# Patient Record
Sex: Female | Born: 1995 | Race: Black or African American | Hispanic: No | Marital: Single | Smoking: Current some day smoker
Health system: Southern US, Community
[De-identification: ages and names within clinical notes are randomized; demographics above are authoritative.]

## PROBLEM LIST (undated history)

## (undated) ENCOUNTER — Emergency Department (HOSPITAL_COMMUNITY): Discharge status: Absent without leave | Payer: No Typology Code available for payment source

## (undated) DIAGNOSIS — T7840XA Allergy, unspecified, initial encounter: Secondary | ICD-10-CM

## (undated) HISTORY — DX: Allergy, unspecified, initial encounter: T78.40XA

---
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
## 2013-01-04 ENCOUNTER — Ambulatory Visit (INDEPENDENT_AMBULATORY_CARE_PROVIDER_SITE_OTHER): Payer: Medicaid Other | Admitting: Pediatrics

## 2013-01-04 ENCOUNTER — Encounter: Payer: Self-pay | Admitting: Pediatrics

## 2013-01-04 VITALS — BP 110/86 | Wt 122.8 lb

## 2013-01-04 DIAGNOSIS — Z3046 Encounter for surveillance of implantable subdermal contraceptive: Secondary | ICD-10-CM

## 2013-01-04 DIAGNOSIS — Z30017 Encounter for initial prescription of implantable subdermal contraceptive: Secondary | ICD-10-CM

## 2013-01-04 DIAGNOSIS — A749 Chlamydial infection, unspecified: Secondary | ICD-10-CM

## 2013-01-04 DIAGNOSIS — Z309 Encounter for contraceptive management, unspecified: Secondary | ICD-10-CM

## 2013-01-04 LAB — POCT URINE PREGNANCY: Preg Test, Ur: NEGATIVE

## 2013-01-04 LAB — RPR

## 2013-01-04 MED ORDER — AZITHROMYCIN 500 MG PO TABS
1000.0000 mg | ORAL_TABLET | Freq: Once | ORAL | Status: AC
  Administered 2013-01-04: 1000 mg via ORAL

## 2013-01-04 NOTE — Patient Instructions (Signed)
Follow-up with Dr. Perry in 1 month. Schedule this appointment before you leave clinic today.  Congratulations on getting your Nexplanon placement!  Below is some important information about Nexplanon.  First remember that Nexplanon does not prevent sexually transmitted infections.  Condoms will help prevent sexually transmitted infections. The Nexplanon starts working 7 days after it was inserted.  There is a risk of getting pregnant if you have unprotected sex in those first 7 days after placement of the Nexplanon.  The Nexplanon lasts for 3 years but can be removed at any time.  You can become pregnant as early as 1 week after removal.  You can have a new Nexplanon put in after the old one is removed if you like.  It is not known whether Nexplanon is as effective in women who are very overweight because the studies did not include many overweight women.  Nexplanon interacts with some medications, including barbiturates, bosentan, carbamazepine, felbamate, griseofulvin, oxcarbazepine, phenytoin, rifampin, St. John's wort, topiramate, HIV medicines.  Please alert your doctor if you are on any of these medicines.  Always tell other healthcare providers that you have a Nexplanon in your arm.  The Nexplanon was placed just under the skin.  Leave the outside bandage on for 24 hours.  Leave the smaller bandage on for 3-5 days or until it falls off on its own.  Keep the area clean and dry for 3-5 days. There is usually bruising or swelling at the insertion site for a few days to a week after placement.  If you see redness or pus draining from the insertion site, call us immediately.  Keep your user card with the date the implant was placed and the date the implant is to be removed.  The most common side effect is a change in your menstrual bleeding pattern.   This bleeding is generally not harmful to you but can be annoying.  Call or come in to see us if you have any concerns about the bleeding or if  you have any side effects or questions.    We will call you in 1 week to check in and we would like you to return to the clinic for a follow-up visit in 1 month.  You can call  [facility] 24 hours a day with any questions or concerns.  There is always a nurse or doctor available to take your call.  Call 9-1-1 if you have a life-threatening emergency.  For anything else, please call us at [phone] before heading to the ER.  Please call her in 1 week to check in and also ensure she has a f/u appt scheduled in 1 month. 

## 2013-01-04 NOTE — Progress Notes (Signed)
Judith Harvey is 17 y.o. female [id number]   HPI: Pt is here for Nexplanon insertion.   Concerns today: Chlamydia positive on last appointment. Pt reports no abdominal pain currently. She experienced mild cramping after her taking the Plan B last week. She then had a normal period to follow. She denies vaginal discharge, irritation or discomfort.   No contraindications for placement.  No liver disease, no unexplained vaginal bleeding, no h/o breast cancer, no h/o blood clots.  Patient's last menstrual period was 12/28/2012. Normal menses.   UHCG: negative  Last Unprotected sex:  12/23/2012; with use of plan B after.   Risks & benefits of Nexplanon discussed The nexplanon device was purchased and supplied by [facility]. Packaging instructions supplied to patient Consent form signed  Current Outpatient Prescriptions on File Prior to Visit  Medication Sig Dispense Refill  . loratadine (CLARITIN) 10 MG tablet Take 1 tablet (10 mg total) by mouth daily. As needed for allergy symptoms  30 tablet  11  . adapalene (DIFFERIN) 0.1 % cream Apply topically at bedtime.  45 g  0  . levonorgestrel (PLAN B 1-STEP) 1.5 MG tablet Take 1 tablet (1.5 mg total) by mouth once.  1 tablet  0   No current facility-administered medications on file prior to visit.    The patient denies any allergies to anesthetics or antiseptics.  Patient Active Problem List   Diagnosis Date Noted  . Chlamydia 01/04/2013   Objective: BP 110/86  Wt 122 lb 12.8 oz (55.702 kg)  BMI 20.61 kg/m2  LMP 12/28/2012 Gen: NAD. Well. Pleasant. Abd: Soft. NTND. No masses palpated.   Procedure: Pt was placed in supine position. Left arm was flexed at the elbow and externally rotated so that her wrist was parallel to her ear The medial epicondyle of the left arm was identified The insertions site was marked 8 cm proximal to the medial epicondyle The insertion site was cleaned with Betadine The area surrounding the insertion site  was covered with a sterile drape 1% lidocaine was injected just under the skin at the insertion site extending 4 cm proximally. The sterile preloaded disposable Nexaplanon applicator was removed from the sterile packaging The applicator needle was inserted at a 30 degree angle at 8 cm proximal to the medial epicondyle as marked The applicator was lowered to a horizontal position and advanced just under the skin for the full length of the needle The slider on the applicator was retracted fully while the applicator remained in the same position, then the applicator was removed. The implant was confirmed via palpation as being in position The implant position was demonstrated to the patient Pressure dressing was applied to the patient.  The patient was instructed to removed the pressure dressing in 24 hrs.  The patient was advised to move slowly from a supine to an upright position  The patient denied any concerns or complaints  The patient was instructed to schedule a follow-up appt in 1 month. The patient will be called in 1 week to address any concerns. The patient will be treated with Azithromycin 1g once. Both parties denies allergies to Azithromycin. Partner was treated as well today. Chlamydia discussed with patient and her boyfriend in detail along with printed information given to both parties about chlamydia.   Felix Pacini, DO PGY-2 [facility]

## 2013-01-04 NOTE — Assessment & Plan Note (Signed)
-   has been in a monogamous relationship with her current boyfriend since end of May/June.  - Will treat partner and patient today with 1g Azithromycin.

## 2013-01-06 NOTE — Progress Notes (Signed)
I saw and evaluated the patient, performing the key elements of the service.  I developed the management plan that is described in the resident's note, and I agree with the content.  Nexplanon placed without complications.  Pt received 1000 mg azithromycin for treatment of chlamydia.  EPT was provided for patient's partner.  Abdn soft, NT.  No dyspareunia. Return in 1 month for f/u after nexplanon placement and check for reinfection in 3 months.

## 2013-01-14 ENCOUNTER — Telehealth: Payer: Self-pay

## 2013-01-14 NOTE — Telephone Encounter (Signed)
I attempted to call [facility]'s number and it is no longer in service.  She has not had a PPD test in our office and if needed, she can come in and have that done as a nurse's visit.  Dr. Marina Goodell has signed the physical forms and I can complete is any of you speak to her or her mother.  Thank you.

## 2013-04-07 ENCOUNTER — Ambulatory Visit: Payer: Medicaid Other | Admitting: Pediatrics

## 2013-08-13 ENCOUNTER — Emergency Department (HOSPITAL_COMMUNITY)
Admission: EM | Admit: 2013-08-13 | Discharge: 2013-08-13 | Disposition: A | Discharge status: Home / Self Care | Payer: Medicaid Other | Attending: Emergency Medicine | Admitting: Emergency Medicine

## 2013-08-13 ENCOUNTER — Encounter (HOSPITAL_COMMUNITY): Payer: Self-pay | Admitting: Emergency Medicine

## 2013-08-13 DIAGNOSIS — M26629 Arthralgia of temporomandibular joint, unspecified side: Secondary | ICD-10-CM | POA: Insufficient documentation

## 2013-08-13 MED ORDER — IBUPROFEN 800 MG PO TABS: 800.0000 mg | ORAL_TABLET | Freq: Three times a day (TID) | ORAL | Status: DC

## 2013-08-13 NOTE — ED Notes (Addendum)
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 Can open and close jaw.

## 2013-08-13 NOTE — Discharge Instructions (Signed)
Take ibuprofen as needed for pain. Follow up with your doctor for further evaluation.

## 2013-08-13 NOTE — ED Notes (Addendum)
Family member pokes head out asking about cream for itching for rash on face. PA recommended hydrocortisone cream.  Also requesting waters all around. Given.

## 2013-08-13 NOTE — ED Provider Notes (Signed)
CSN: [id number]     Arrival date & time 08/13/13  1245 History  This chart was scribed for non-physician practitioner, Emilia Beck, PA-C, working with Enid Skeens, MD by Shari Heritage, ED Scribe. This patient was seen in room TR10C/TR10C and the patient's care was started at 1:46 PM.      Chief Complaint  Patient presents with  . Jaw Pain     Patient is a 18 y.o. female presenting with tooth pain. The history is provided by the patient. No language interpreter was used.  Dental Pain Toothache location: left jaw pain. Quality:  Aching Duration:  1 day Timing:  Constant Context: not malocclusion and not trauma   Associated symptoms: no difficulty swallowing   Risk factors comment:  Chewing gum   HPI Comments: MEGHNA HAGMANN is a 18 y.o. female who presents to the Emergency Department complaining of constant, aching left jaw pain onset last night. She has taken Tylenol which provided transient relief. Patient has a history of TMJ and reports that current jaw pain is similar to that. She says that she has been chewing gum which she was instructed not to do given her history of TMJ. She denies any obvious injury or trauma. There is no sore throat, dysphagia, shortness of breath or voice change. She denies malocclusion. Patient has no chronic medical conditions except seasonal allergies.   Past Medical History  Diagnosis Date  . Allergy    History reviewed. No pertinent past surgical history. No family history on file. History  Substance Use Topics  . Smoking status: Never Smoker   . Smokeless tobacco: Never Used  . Alcohol Use: No   OB History   Grav Para Term Preterm Abortions TAB SAB Ect Mult Living                 Review of Systems  HENT: Negative for sore throat and trouble swallowing.        Positive for jaw pain.   Respiratory: Negative for shortness of breath.   All other systems reviewed and are negative.      Allergies  Review of patient's allergies  indicates no known allergies.  Home Medications   Current Outpatient Rx  Name  Route  Sig  Dispense  Refill  . adapalene (DIFFERIN) 0.1 % cream   Topical   Apply topically at bedtime.   45 g   0   . etonogestrel (IMPLANON) 68 MG IMPL implant   Subcutaneous   Inject 1 each (68 mg total) into the skin once.   1 each   0   . levonorgestrel (PLAN B 1-STEP) 1.5 MG tablet   Oral   Take 1 tablet (1.5 mg total) by mouth once.   1 tablet   0   . loratadine (CLARITIN) 10 MG tablet   Oral   Take 1 tablet (10 mg total) by mouth daily. As needed for allergy symptoms   30 tablet   11    Triage Vitals: BP 116/72  Pulse 108  Temp(Src) 99 F (37.2 C) (Oral)  Resp 20  Ht 5' 4.5" (1.638 m)  Wt 121 lb (54.885 kg)  BMI 20.46 kg/m2  SpO2 100% Physical Exam  Nursing note and vitals reviewed. Constitutional: She is oriented to person, place, and time. She appears well-developed and well-nourished. No distress.  HENT:  Head: Normocephalic and atraumatic.  Eyes: EOM are normal.  Neck: Neck supple. No tracheal deviation present.  Cardiovascular: Normal rate.   Pulmonary/Chest: Effort  normal. No respiratory distress.  Musculoskeletal: Normal range of motion.  Neurological: She is alert and oriented to person, place, and time.  Skin: Skin is warm and dry.  Psychiatric: She has a normal mood and affect. Her behavior is normal.    ED Course  Procedures (including critical care time) DIAGNOSTIC STUDIES: Oxygen Saturation is 100% on room air, normal by my interpretation.    COORDINATION OF CARE: 1:19 PM- Patient informed of current plan for treatment and evaluation and agrees with plan at this time.     MDM   Final diagnoses:  TMJ arthralgia    Patient likely has TMJ and will be discharged with ibuprofen. No further evaluation needed at this time.   I personally performed the services described in this documentation, which was scribed in my presence. The recorded information  has been reviewed and is accurate.    Emilia BeckKaitlyn Josilynn Losh, [place] JerseyPA-C 08/13/13 1637

## 2013-08-13 NOTE — ED Notes (Signed)
Pt states L sided jaw pain, onset this morning after waking up. History of TMJ. Pt states L sided of jaw was very swollen this morning. Respirations unlabored. Pt is alert and oriented x4.

## 2013-08-14 NOTE — ED Provider Notes (Signed)
Medical screening examination/treatment/procedure(s) were performed by non-physician practitioner and as supervising physician I was immediately available for consultation/collaboration.   EKG Interpretation None        Enid SkeensJoshua M Lauretta Sallas, MD 08/14/13 2141

## 2013-09-05 ENCOUNTER — Encounter (HOSPITAL_COMMUNITY): Payer: Self-pay | Admitting: Emergency Medicine

## 2013-09-05 ENCOUNTER — Emergency Department (HOSPITAL_COMMUNITY)
Admission: EM | Admit: 2013-09-05 | Discharge: 2013-09-05 | Disposition: A | Discharge status: Home / Self Care | Payer: Medicaid Other | Attending: Emergency Medicine | Admitting: Emergency Medicine

## 2013-09-05 ENCOUNTER — Emergency Department (HOSPITAL_COMMUNITY): Payer: Medicaid Other

## 2013-09-05 DIAGNOSIS — S6990XA Unspecified injury of unspecified wrist, hand and finger(s), initial encounter: Secondary | ICD-10-CM | POA: Insufficient documentation

## 2013-09-05 DIAGNOSIS — S1093XA Contusion of unspecified part of neck, initial encounter: Principal | ICD-10-CM

## 2013-09-05 DIAGNOSIS — S60229A Contusion of unspecified hand, initial encounter: Secondary | ICD-10-CM | POA: Insufficient documentation

## 2013-09-05 DIAGNOSIS — S0003XA Contusion of scalp, initial encounter: Secondary | ICD-10-CM | POA: Insufficient documentation

## 2013-09-05 DIAGNOSIS — S0083XA Contusion of other part of head, initial encounter: Secondary | ICD-10-CM

## 2013-09-05 DIAGNOSIS — S6980XA Other specified injuries of unspecified wrist, hand and finger(s), initial encounter: Secondary | ICD-10-CM | POA: Insufficient documentation

## 2013-09-05 MED ORDER — IBUPROFEN 800 MG PO TABS: 800.0000 mg | ORAL_TABLET | Freq: Three times a day (TID) | ORAL | Status: DC | PRN
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
## 2013-09-05 NOTE — Discharge Instructions (Signed)
Return here as needed.  Followup with your Dr. for recheck.  Use ice, on the areas that are swollen.  There is no broken bones noted on your scans
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
## 2013-09-27 ENCOUNTER — Emergency Department (HOSPITAL_COMMUNITY)
Admission: EM | Admit: 2013-09-27 | Discharge: 2013-09-27 | Discharge status: Against Medical Advice | Payer: Medicaid Other | Attending: Emergency Medicine | Admitting: Emergency Medicine

## 2013-09-27 ENCOUNTER — Encounter (HOSPITAL_COMMUNITY): Payer: Self-pay | Admitting: Emergency Medicine

## 2013-09-27 DIAGNOSIS — H5789 Other specified disorders of eye and adnexa: Secondary | ICD-10-CM | POA: Insufficient documentation

## 2013-09-27 NOTE — ED Notes (Signed)
Patient's family member had said that they may have to leave earlier when I was in the room, but did not say they were leaving.   PA went into room and noone was there.

## 2013-09-27 NOTE — ED Notes (Signed)
Pt states she has a sty in her L eye.  Pt states it came up Sunday.  Pt has redness and swelling to L eye.

## 2014-02-21 ENCOUNTER — Encounter: Payer: Self-pay | Admitting: Pediatrics

## 2014-02-21 ENCOUNTER — Ambulatory Visit (INDEPENDENT_AMBULATORY_CARE_PROVIDER_SITE_OTHER): Payer: Medicaid Other | Admitting: Pediatrics

## 2014-02-21 ENCOUNTER — Ambulatory Visit (INDEPENDENT_AMBULATORY_CARE_PROVIDER_SITE_OTHER): Payer: No Typology Code available for payment source | Admitting: Clinical

## 2014-02-21 VITALS — BP 130/98 | Ht 64.75 in | Wt 126.2 lb

## 2014-02-21 DIAGNOSIS — A749 Chlamydial infection, unspecified: Secondary | ICD-10-CM

## 2014-02-21 DIAGNOSIS — Z23 Encounter for immunization: Secondary | ICD-10-CM

## 2014-02-21 DIAGNOSIS — N938 Other specified abnormal uterine and vaginal bleeding: Secondary | ICD-10-CM

## 2014-02-21 DIAGNOSIS — F4322 Adjustment disorder with anxiety: Secondary | ICD-10-CM

## 2014-02-21 DIAGNOSIS — N925 Other specified irregular menstruation: Secondary | ICD-10-CM

## 2014-02-21 DIAGNOSIS — Z3046 Encounter for surveillance of implantable subdermal contraceptive: Secondary | ICD-10-CM

## 2014-02-21 DIAGNOSIS — N949 Unspecified condition associated with female genital organs and menstrual cycle: Secondary | ICD-10-CM

## 2014-02-21 DIAGNOSIS — Z130 Encounter for screening for diseases of the blood and blood-forming organs and certain disorders involving the immune mechanism: Secondary | ICD-10-CM

## 2014-02-21 LAB — POCT HEMOGLOBIN: Hemoglobin: 12.9 g/dL (ref 12.2–16.2)

## 2014-02-21 LAB — TSH: TSH: 0.728 u[IU]/mL (ref 0.350–4.500)

## 2014-02-21 NOTE — Progress Notes (Signed)
1:07 PM Adolescent Medicine Consultation Follow-Up Visit Judith Harvey  is a 18 y.o. female referred by Dr. Renae Fickle here today for follow-up of nexplanon placement.   PCP Confirmed?  yes  PAUL,MELINDA C, MD   History was provided by the patient.  Chart review:  Last seen by Dr. Marina Goodell on 01/04/13.  Treatment plan at last visit included nexplanon placement and treatment for positive chlamydia.  She has not followed up in our clinic since then.   Previous Psych Screenings:  None Psych screenings completed for today's visit: None  Last CPE: 12/28/2012 Immunizations: HPV#1 Growth Chart Viewed? yes  Last STI screen: 12/28/12 Pos GC/CT Pertinent Labs: None  HPI:  Pt reports she is here to check on her nexplanon.  She had bleeding all of June and a lot of July.  Stopped for a few days, then started again.  Prior to that was having a monthly cycle.  Increased bleeding occurred with significant stress.  Has not had any bleeding for 3 days.  Occasional cramping with the bleeding.    Has been having increased HAs as well.  Goes all day without eating, often has HAs although might still have a migraine.  Started around when her Dad passed away.    Patient's last menstrual period was 02/17/2014.  ROS:  No dysuria.  Positive vaginal discharge.  No dyspareunia. Currently sexually active, same partner for 2 years.  He was treated for chlamydia.   The following portions of the patient's history were reviewed and updated as appropriate: allergies, current medications, past social history and problem list.  No Known Allergies  Social History: Confidentiality was discussed with the patient and if applicable, with caregiver as well.  Patient's personal or confidential phone number: [phone] Tobacco? yes, black milds once daily, Buys one per week. Secondhand smoke exposure?yes, not  Drugs/EtOH?no Sexually active?yes Pregnancy Prevention: Nexplanon, reviewed condoms & plan B Safe at home, in  school & in relationships? Yes Guns in the home? no Safe to self? Yes  Physical Exam:  Filed Vitals:   02/21/14 1219  BP: 130/98  Height: 5' 4.75" (1.645 m)  Weight: 126 lb 3.2 oz (57.244 kg)   BP 130/98  Ht 5' 4.75" (1.645 m)  Wt 126 lb 3.2 oz (57.244 kg)  BMI 21.15 kg/m2  LMP 02/17/2014 Body mass index: body mass index is 21.15 kg/(m^2). Blood pressure percentiles are 98% systolic and 100% diastolic based on 2000 NHANES data. Blood pressure percentile targets: 90: 122/77, 95: 126/81, 99: 138/94.  Physical Exam  Constitutional: No distress.  HENT:  Mouth/Throat: Oropharynx is clear and moist. No oropharyngeal exudate.  Neck: No thyromegaly present.  Cardiovascular: Normal rate and regular rhythm.   No murmur heard. Pulmonary/Chest: Breath sounds normal.  Abdominal: Soft. There is no tenderness. There is no guarding.  Genitourinary: There is no rash, tenderness or lesion on the right labia. There is no rash, tenderness or lesion on the left labia. Uterus is not tender. Cervix exhibits discharge. Cervix exhibits no motion tenderness and no friability. Right adnexum displays no mass and no tenderness. Left adnexum displays no mass and no tenderness. No erythema or tenderness around the vagina. Vaginal discharge found.  Musculoskeletal: She exhibits no edema.  Lymphadenopathy:    She has no cervical adenopathy.    Assessment/Plan: 1. DUB (dysfunctional uterine bleeding) Discussed DUB may be due to nexplanon but should rule out other causes, such as infection or endocrinopathy. - GC/Chlamydia Probe Amp - TSH - WET PREP BY MOLECULAR  PROBE  2. Chlamydia Retest today to ensure no re-infection/  3. Surveillance of previously prescribed implantable subdermal contraceptive As above.  Cont Nexplanon for now and rule out other etiologies of DUB.  4. Screening for iron deficiency anemia - POCT hemoglobin  5. Need for prophylactic vaccination and inoculation against unspecified  single disease - HPV 9-valent vaccine,Recombinat   Follow-up:  1 month  Medical decision-making:  > 25 minutes spent, more than 50% of appointment was spent discussing diagnosis and management of symptoms

## 2014-02-21 NOTE — Progress Notes (Signed)
Referring Provider: Delorse Lek, MD Session Time:  1345 - 1415 (30 minutes) Type of Service: Behavioral Health - Individual/Family Interpreter: No.  Interpreter Name & Language: N/A   PRESENTING CONCERNS:  Judith Harvey is a 18 y.o. female brought in by self. Judith Harvey was referred to [facility] for recent stressors & death of her father in 12-30-13.  GOALS ADDRESSED:  Increase knowledge on the effects of stress and positive coping skills to reduce stress.   INTERVENTIONS:  This Behavioral Health Clinician clarified [facility] role, discussed confidentiality and built rapport.   Assessed current condition/needs Provided psycho education on stress management   ASSESSMENT/OUTCOME:  Judith Harvey was open in discussing her situation and stressors in the last few months. Judith Harvey moved in with her mother & siblings after the death of her father to help her family.  Judith Harvey is also working 12 hour shifts.  These stressors have affected her sleeping & eating habits.  Judith Harvey was open to strategies to decrease her stress and was able to identify a plan for immediate self-care and ongoing stress management in the future.   PLAN:  Judith Harvey will review the information on coping strategies to help her relax.   Today she will take the time to eat and take time for herself on her day off.  Scheduled next visit: Joint visit with Dr. Marina Goodell on 04/05/14.  Judith Harvey P. Mayford Knife, MSW, [employer] Behavioral Health Clinician [facility]

## 2014-02-22 ENCOUNTER — Telehealth: Payer: Self-pay | Admitting: Pediatrics

## 2014-02-22 DIAGNOSIS — A749 Chlamydial infection, unspecified: Secondary | ICD-10-CM

## 2014-02-22 DIAGNOSIS — N760 Acute vaginitis: Principal | ICD-10-CM

## 2014-02-22 DIAGNOSIS — B9689 Other specified bacterial agents as the cause of diseases classified elsewhere: Secondary | ICD-10-CM | POA: Insufficient documentation

## 2014-02-22 LAB — WET PREP BY MOLECULAR PROBE
Candida species: NEGATIVE
GARDNERELLA VAGINALIS: POSITIVE — AB
TRICHOMONAS VAG: NEGATIVE

## 2014-02-22 LAB — GC/CHLAMYDIA PROBE AMP
CT Probe RNA: POSITIVE — AB
GC Probe RNA: NEGATIVE

## 2014-02-22 MED ORDER — METRONIDAZOLE 500 MG PO TABS: 500.0000 mg | ORAL_TABLET | Freq: Two times a day (BID) | ORAL | Status: AC

## 2014-02-22 MED ORDER — AZITHROMYCIN 500 MG PO TABS: 1000.0000 mg | ORAL_TABLET | Freq: Once | ORAL | Status: DC

## 2014-02-22 NOTE — Telephone Encounter (Signed)
Spoke with patient and informed of positive chlamydia infection.  Pt reports she and her partner did not wait 7 days when treated the last time.  Discussed importance of her treatment and her partner's treatment.  Advised to wait 7 days before any sexual contact.  Advised we will recheck in 6 weeks-3 months.  Advised of bacterial vaginosis and will treat for that as well.  Her DUB may be secondary to these infections.  She acknowledged agreement and understanding of the plan.

## 2014-03-06 ENCOUNTER — Telehealth: Payer: Self-pay | Admitting: Pediatrics

## 2014-03-06 NOTE — Telephone Encounter (Signed)
This pt called in stating she has a question about ZITHROMAX , she would like to know if she has to get a refill or not. If you can call her and let her know, she uses [facility] off [address]

## 2014-03-09 NOTE — Telephone Encounter (Signed)
Called and left a VM on patient's personal number to call and specify if this is for herself or partner.

## 2014-03-13 NOTE — Telephone Encounter (Signed)
Received TC from patient- she stated that she lost one of the prescriptions for the [employer] PsZITHROMAX 500mg  and needs another prescription written. Patient reported that the medication is needed to treat both her and her partner.  Informed pt that message will be sent to Dr. Marina GoodellPerry with her request.

## 2014-03-15 MED ORDER — AZITHROMYCIN 500 MG PO TABS: ORAL_TABLET | ORAL | Status: DC

## 2014-03-15 NOTE — Telephone Encounter (Addendum)
Please advise patient I sent in a prescription for her and her partner.  She will pick up 4 pills.  She should take 2 pills and her partner should take 2 pills.  She should not have any sexual contact for 1 week following treatment.  Please reschedule her next appt to 6 weeks from this treatment date.

## 2014-03-15 NOTE — Addendum Note (Signed)
Addended by: Delorse LekPERRY, Zahid Carneiro F on: 03/15/2014 03:57 PM   Modules accepted: Orders

## 2014-03-16 NOTE — Telephone Encounter (Signed)
Left VM with patient asking for a return call. Left direct contact information in message.

## 2014-03-22 NOTE — Telephone Encounter (Signed)
TC to patient- she has already picked up the medication and taken them as instructed.

## 2014-04-03 ENCOUNTER — Encounter: Payer: Self-pay | Admitting: Pediatrics

## 2014-04-03 NOTE — Progress Notes (Signed)
Pre-Visit Planning  Previous Psych Screenings:  None  Review of previous notes:  Last seen by Dr. Marina GoodellPerry on 02/21/14.  Treatment plan at last visit included assessment of DUB which included tx for positive chlamydia and BV.   Last CPE: Due with Dr. Renae FicklePaul  Last STI screen: 02/21/14, chlamydia positive  Pertinent Labs: HIV 12/28/12 non-reactive, RPR non-reactive   Immunizations Due: Flu Psych Screenings Due: None  To Do at visit:  Reassess DUB and treatment for her and her partner of chlamydia. Unfortunately d/t patient losing the prescription her tx did not occur until around 03/15/14. It is too early to test for re-infection.

## 2014-04-05 ENCOUNTER — Encounter: Payer: Medicaid Other | Admitting: Clinical

## 2014-04-05 ENCOUNTER — Ambulatory Visit: Payer: Self-pay | Admitting: Pediatrics

## 2016-01-03 ENCOUNTER — Ambulatory Visit: Payer: Self-pay | Admitting: Pediatrics

## 2016-01-03 ENCOUNTER — Ambulatory Visit: Payer: Medicaid Other | Admitting: Pediatrics

## 2016-01-07 ENCOUNTER — Encounter: Payer: Self-pay | Admitting: Pediatrics

## 2016-01-07 ENCOUNTER — Ambulatory Visit (INDEPENDENT_AMBULATORY_CARE_PROVIDER_SITE_OTHER): Payer: Medicaid Other | Admitting: Pediatrics

## 2016-01-07 DIAGNOSIS — Z3202 Encounter for pregnancy test, result negative: Secondary | ICD-10-CM | POA: Diagnosis not present

## 2016-01-07 DIAGNOSIS — Z113 Encounter for screening for infections with a predominantly sexual mode of transmission: Secondary | ICD-10-CM

## 2016-01-07 DIAGNOSIS — Z3049 Encounter for surveillance of other contraceptives: Secondary | ICD-10-CM

## 2016-01-07 DIAGNOSIS — Z3046 Encounter for surveillance of implantable subdermal contraceptive: Secondary | ICD-10-CM

## 2016-01-07 DIAGNOSIS — Z30017 Encounter for initial prescription of implantable subdermal contraceptive: Secondary | ICD-10-CM

## 2016-01-07 LAB — HIV ANTIBODY (ROUTINE TESTING W REFLEX): HIV: NONREACTIVE

## 2016-01-07 LAB — POCT URINE PREGNANCY: Preg Test, Ur: NEGATIVE

## 2016-01-07 MED ORDER — ETONOGESTREL 68 MG ~~LOC~~ IMPL
68.0000 mg | DRUG_IMPLANT | Freq: Once | SUBCUTANEOUS | Status: AC
  Administered 2016-01-07: 68 mg via SUBCUTANEOUS

## 2016-01-07 NOTE — Progress Notes (Signed)
THIS RECORD MAY CONTAIN CONFIDENTIAL INFORMATION THAT SHOULD NOT BE RELEASED WITHOUT REVIEW OF THE SERVICE PROVIDER.  Adolescent Medicine Consultation Follow-Up Visit Judith Harvey  is a 20 y.o. female referred by Maree Erie, MD here today for follow-up.    Previsit planning completed:  no  Growth Chart Viewed? yes   History was provided by the patient.  PCP Confirmed?  yes  My Chart Activated?   no   HPI:    Here for nexplanon removal and replacement. When she first got it she had some bleeding but it was better after that.  She is a Production designer, theatre/television/film at [employer]. Living on her own.  Still with the same partner but not ready to have kids. Wants to get career going.  Some intermenstrual spotting. Periods are typically about 5-6 days, regular. She gets migraines infrequently and will lay down in the dark to help, takes medication every now and again.   Review of Systems  Constitutional: Negative for malaise/fatigue and weight loss.  Eyes: Negative for blurred vision.  Respiratory: Negative for shortness of breath.   Cardiovascular: Negative for chest pain and palpitations.  Gastrointestinal: Negative for abdominal pain, constipation, nausea and vomiting.  Genitourinary: Negative for dysuria.  Musculoskeletal: Negative for myalgias.  Neurological: Positive for headaches. Negative for dizziness.  Psychiatric/Behavioral: Negative for depression.     Patient's last menstrual period was 12/04/2015 (exact date). No Known Allergies Outpatient Medications Prior to Visit  Medication Sig Dispense Refill  . etonogestrel (NEXPLANON) 68 MG IMPL implant Inject 1 each into the skin once.    Marland Kitchen azithromycin (ZITHROMAX) 500 MG tablet Take 2 pills x 1 for treatment.  Give 2 pills to your partner for treatment.  Expedited Partner Therapy 4 tablet 0  . ibuprofen (ADVIL,MOTRIN) 800 MG tablet Take 800 mg by mouth every 8 (eight) hours as needed for headache.    Cliffton Asters Petrolatum-Mineral Oil  (STYE) 31.9-57.7 % OINT Place 1 application into the left eye 4 (four) times daily.     No facility-administered medications prior to visit.      Patient Active Problem List   Diagnosis Date Noted  . Bacterial vaginosis 02/22/2014  . Chlamydia 01/04/2013  . 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  Patient's personal or confidential phone number:  Enter confidential phone number in Family Comments section of [employer] Tobacco?  Yes- black and milds not often Drugs/ETOH?  no Partner preference?  female Sexually Active?  yes  Pregnancy Prevention:  implant, reviewed condoms & plan B    The following portions of the patient's history were reviewed and updated as appropriate: allergies, current medications, past family history, past medical history, past social history and past surgical history.  Physical Exam:  Vitals:   01/07/16 1527  BP: (!) (P) 142/97  Pulse: (P) 90  Weight: (P) 123 lb (55.8 kg)  Height: (P) 5' 5.5" (1.664 m)   BP (!) (P) 142/97   Pulse (P) 90   Ht (P) 5' 5.5" (1.664 m)   Wt (P) 123 lb (55.8 kg)   LMP 12/04/2015 (Exact Date)   BMI (P) 20.16 kg/m  Body mass index: body mass index is 20.16 kg/m (pended). Growth percentile SmartLinks can only be used for  patients less than 22 years old.  Physical Exam  Constitutional: She is oriented to person, place, and time. She appears well-developed and well-nourished.  HENT:  Head: Normocephalic.  Neck: No thyromegaly present.  Cardiovascular: Normal rate, regular rhythm, normal heart sounds and intact distal pulses.   Pulmonary/Chest: Effort normal and breath sounds normal.  Abdominal: Soft. Bowel sounds are normal. There  is no tenderness.  Musculoskeletal: Normal range of motion.  Neurological: She is alert and oriented to person, place, and time.  Skin: Skin is warm and dry.  Psychiatric: She has a normal mood and affect.    Assessment/Plan: 1. Insertion of Nexplanon See procedure note. Tolerated well.   2. Nexplanon removal See procedure note. Easy removal. Tolerated well.   3. Routine screening for STI (sexually transmitted infection) Per patient request and clinic protocol. Was positive for chlamydia at last OV in 2015 and has not had a repeat. Will treat accordingly. Currently asymptomatic; still with same partner from that time.  - GC/Chlamydia Probe Amp - HIV antibody (with reflex) - RPR  4. Pregnancy examination or test, negative result Per protocol. Negative.  - POCT urine pregnancy   Follow-up:  PRN with questions or concerns about nexplanon site. Will need to transition to family med soon.   Medical decision-making:  > 25 minutes spent, more than 50% of appointment was spent discussing diagnosis and management of symptoms

## 2016-01-07 NOTE — Progress Notes (Signed)
Risks & benefits of Nexplanon removal discussed. Consent form signed.  The patient denies any allergies to anesthetics or antiseptics.  Procedure: Pt was placed in supine position. left arm was flexed at the elbow and externally rotated so that her wrist was parallel to her ear, The device was palpated and marked. The site was cleaned with Betadine. The area surrounding the device was covered with a sterile drape. 1% lidocaine was injected just under the device. A scalpel was used to create a small incision. The device was pushed towards the incision. Fibrous tissue surrounding the device was gradually removed from the device. The device was removed and measured to ensure all 4 cm of device was removed.   Nexplanon Insertion  No contraindications for placement.  No liver disease, no unexplained vaginal bleeding, no h/o breast cancer, no h/o blood clots.  Patient's last menstrual period was 12/04/2015 (exact date).  UHCG: neg  Last Unprotected sex:  None   Risks & benefits of Nexplanon discussed The nexplanon device was purchased and supplied by [facility]. Packaging instructions supplied to patient Consent form signed  The patient denies any allergies to anesthetics or antiseptics.  Procedure: Pt was placed in supine position. The left arm was flexed at the elbow and externally rotated so that her wrist was parallel to her ear The medial epicondyle of the left arm was identified The insertions site was marked 8 cm proximal to the medial epicondyle The insertion site was cleaned with Betadine The area surrounding the insertion site was covered with a sterile drape 1% lidocaine was injected just under the skin at the insertion site extending 4 cm proximally. The sterile preloaded disposable Nexaplanon applicator was removed from the sterile packaging The applicator needle was inserted at a 30 degree angle at 8 cm proximal to the medial epicondyle as marked The applicator was  lowered to a horizontal position and advanced just under the skin for the full length of the needle The slider on the applicator was retracted fully while the applicator remained in the same position, then the applicator was removed. The implant was confirmed via palpation as being in position The implant position was demonstrated to the patient Pressure dressing was applied to the patient.  The patient was instructed to removed the pressure dressing in 24 hrs.  The patient was advised to move slowly from a supine to an upright position  The patient denied any concerns or complaints  The patient was instructed to schedule a follow-up appt in 1 month and to call sooner if any concerns.  The patient acknowledged agreement and understanding of the plan.

## 2016-01-07 NOTE — Patient Instructions (Signed)
Follow-up with Dr. Perry in 1 month. Schedule this appointment before you leave clinic today.  Congratulations on getting your Nexplanon placement!  Below is some important information about Nexplanon.  First remember that Nexplanon does not prevent sexually transmitted infections.  Condoms will help prevent sexually transmitted infections. The Nexplanon starts working 7 days after it was inserted.  There is a risk of getting pregnant if you have unprotected sex in those first 7 days after placement of the Nexplanon.  The Nexplanon lasts for 3 years but can be removed at any time.  You can become pregnant as early as 1 week after removal.  You can have a new Nexplanon put in after the old one is removed if you like.  It is not known whether Nexplanon is as effective in women who are very overweight because the studies did not include many overweight women.  Nexplanon interacts with some medications, including barbiturates, bosentan, carbamazepine, felbamate, griseofulvin, oxcarbazepine, phenytoin, rifampin, St. John's wort, topiramate, HIV medicines.  Please alert your doctor if you are on any of these medicines.  Always tell other healthcare providers that you have a Nexplanon in your arm.  The Nexplanon was placed just under the skin.  Leave the outside bandage on for 24 hours.  Leave the smaller bandage on for 3-5 days or until it falls off on its own.  Keep the area clean and dry for 3-5 days. There is usually bruising or swelling at the insertion site for a few days to a week after placement.  If you see redness or pus draining from the insertion site, call us immediately.  Keep your user card with the date the implant was placed and the date the implant is to be removed.  The most common side effect is a change in your menstrual bleeding pattern.   This bleeding is generally not harmful to you but can be annoying.  Call or come in to see us if you have any concerns about the bleeding or if  you have any side effects or questions.    We will call you in 1 week to check in and we would like you to return to the clinic for a follow-up visit in 1 month.  You can call  [facility] 24 hours a day with any questions or concerns.  There is always a nurse or doctor available to take your call.  Call 9-1-1 if you have a life-threatening emergency.  For anything else, please call us at [phone] before heading to the ER.  

## 2016-01-08 LAB — RPR

## 2016-01-08 LAB — GC/CHLAMYDIA PROBE AMP
CT PROBE, AMP APTIMA: NOT DETECTED
GC Probe RNA: NOT DETECTED

## 2016-03-25 ENCOUNTER — Ambulatory Visit (INDEPENDENT_AMBULATORY_CARE_PROVIDER_SITE_OTHER): Payer: Medicaid Other | Admitting: Pediatrics

## 2016-03-25 ENCOUNTER — Encounter: Payer: Self-pay | Admitting: Pediatrics

## 2016-03-25 VITALS — BP 137/88 | HR 92 | Ht 64.57 in | Wt 119.6 lb

## 2016-03-25 DIAGNOSIS — Z3046 Encounter for surveillance of implantable subdermal contraceptive: Secondary | ICD-10-CM

## 2016-03-25 DIAGNOSIS — Z3202 Encounter for pregnancy test, result negative: Secondary | ICD-10-CM

## 2016-03-25 DIAGNOSIS — Z113 Encounter for screening for infections with a predominantly sexual mode of transmission: Secondary | ICD-10-CM

## 2016-03-25 DIAGNOSIS — Z3049 Encounter for surveillance of other contraceptives: Secondary | ICD-10-CM

## 2016-03-25 DIAGNOSIS — N898 Other specified noninflammatory disorders of vagina: Secondary | ICD-10-CM

## 2016-03-25 DIAGNOSIS — Z130 Encounter for screening for diseases of the blood and blood-forming organs and certain disorders involving the immune mechanism: Secondary | ICD-10-CM

## 2016-03-25 LAB — POCT HEMOGLOBIN: Hemoglobin: 11.8 g/dL — AB (ref 12.2–16.2)

## 2016-03-25 LAB — POCT URINE PREGNANCY: PREG TEST UR: NEGATIVE

## 2016-03-25 NOTE — Patient Instructions (Addendum)
We will send you a mychart message with your results.

## 2016-03-25 NOTE — Progress Notes (Signed)
THIS RECORD MAY CONTAIN CONFIDENTIAL INFORMATION THAT SHOULD NOT BE RELEASED WITHOUT REVIEW OF THE SERVICE PROVIDER.  Adolescent Medicine Consultation Follow-Up Visit Judith Harvey  is a 20 y.o. female referred by Maree ErieStanley, Angela J, MD here today for follow-up regarding nexplanon follow up.   Last seen in Adolescent Medicine Clinic on 01/07/16 for replacement of nexplanon.   Plan at last visit included remove and replace nexplanon.  - Pertinent Labs? No - Growth Chart Viewed? yes   History was provided by the patient.  PCP Confirmed?  Yes- needs to transition to family med as she is about to turn 20.   My Chart Activated?   yes   Chief Complaint  Patient presents with  . Follow-up    Nexplanon Placement f/u     HPI:    No bleeding with nexplanon.  She has had some discharge and cramping about 2 days a week. It smells like a sour smell. It is white or yellow. It is more of a thin consistency. Does have a history of chlamydia and BV.  She has not had any new partners and hopes that her partner has not either.  Site healed up well and looks like it did the last time. It had more bruising but is better.  Review of Systems  Constitutional: Negative for malaise/fatigue.  Eyes: Negative for double vision.  Respiratory: Negative for shortness of breath.   Cardiovascular: Negative for chest pain and palpitations.  Gastrointestinal: Negative for abdominal pain, constipation, diarrhea, nausea and vomiting.  Genitourinary: Negative for dysuria, frequency and urgency.  Musculoskeletal: Negative for joint pain and myalgias.  Skin: Negative for rash.  Neurological: Negative for dizziness and headaches.  Endo/Heme/Allergies: Does not bruise/bleed easily.     No LMP recorded. No Known Allergies No outpatient prescriptions prior to visit.   No facility-administered medications prior to visit.      Patient Active Problem List   Diagnosis Date Noted  . Bacterial vaginosis  02/22/2014  . Chlamydia 01/04/2013  . Surveillance of previously prescribed implantable subdermal contraceptive 01/04/2013     The following portions of the patient's history were reviewed and updated as appropriate: allergies, current medications, past family history, past medical history, past social history and problem list.  Physical Exam:  Vitals:   03/25/16 1349  BP: 137/88  Pulse: 92  Weight: 119 lb 9.6 oz (54.3 kg)  Height: 5' 4.57" (1.64 m)   BP 137/88   Pulse 92   Ht 5' 4.57" (1.64 m)   Wt 119 lb 9.6 oz (54.3 kg)   BMI 20.17 kg/m  Body mass index: body mass index is 20.17 kg/m. Growth percentile SmartLinks can only be used for patients less than 22 years old.   Physical Exam  Constitutional: She appears well-developed. No distress.  HENT:  Mouth/Throat: Oropharynx is clear and moist.  Neck: No thyromegaly present.  Cardiovascular: Normal rate and regular rhythm.   No murmur heard. Pulmonary/Chest: Breath sounds normal.  Abdominal: Soft. She exhibits no mass. There is no tenderness. There is no guarding.  Musculoskeletal: She exhibits no edema.  Lymphadenopathy:    She has no cervical adenopathy.  Neurological: She is alert.  Skin: Skin is warm. No rash noted.  Psychiatric: She has a normal mood and affect.  Nursing note and vitals reviewed.   Assessment/Plan: 1. Surveillance of previously prescribed implantable subdermal contraceptive Site is well healed and nexplanon is palpable in good position.   2. Vaginal discharge Wet prep and gc/chlamydia today. Likely  is recurrence of BV but will also rule out STI and message with results tomorrow.  - WET PREP BY MOLECULAR PROBE  3. Screening for iron deficiency anemia Hemoglobin slightly low but no bleeding. Will continue to monitor. She does not eat a good diet of meats and leafy greens.  - POCT hemoglobin  4. Routine screening for STI (sexually transmitted infection) Per protocol given hx of + chlamydia  and new discharge.  - GC/Chlamydia Probe Amp  5. Pregnancy examination or test, negative result Given no period after replacement wanted to ensure not pregnant. Negative.  - POCT urine pregnancy   Follow-up:  As needed- discussed transition to family med and provided phone number for scheduling. She was agreeable. Will see prior to then for issues with nexplanon   Medical decision-making:  >25 minutes spent face to face with patient with more than 50% of appointment spent discussing diagnosis, management, follow-up, and reviewing the plan of care as noted above.

## 2016-03-26 ENCOUNTER — Other Ambulatory Visit: Payer: Self-pay | Admitting: Pediatrics

## 2016-03-26 ENCOUNTER — Encounter: Payer: Self-pay | Admitting: Pediatrics

## 2016-03-26 DIAGNOSIS — N760 Acute vaginitis: Principal | ICD-10-CM

## 2016-03-26 DIAGNOSIS — B9689 Other specified bacterial agents as the cause of diseases classified elsewhere: Secondary | ICD-10-CM

## 2016-03-26 LAB — WET PREP BY MOLECULAR PROBE
Candida species: NEGATIVE
Gardnerella vaginalis: POSITIVE — AB
Trichomonas vaginosis: NEGATIVE

## 2016-03-26 LAB — GC/CHLAMYDIA PROBE AMP
CT PROBE, AMP APTIMA: NOT DETECTED
GC PROBE AMP APTIMA: NOT DETECTED

## 2016-03-26 MED ORDER — METRONIDAZOLE 500 MG PO TABS: 500.0000 mg | ORAL_TABLET | Freq: Two times a day (BID) | ORAL | 0 refills | Status: DC

## 2016-05-23 ENCOUNTER — Other Ambulatory Visit: Payer: Self-pay | Admitting: Pediatrics

## 2016-05-23 ENCOUNTER — Encounter: Payer: Self-pay | Admitting: Pediatrics

## 2016-05-23 DIAGNOSIS — N760 Acute vaginitis: Principal | ICD-10-CM

## 2016-05-23 DIAGNOSIS — B9689 Other specified bacterial agents as the cause of diseases classified elsewhere: Secondary | ICD-10-CM

## 2016-12-18 ENCOUNTER — Ambulatory Visit (INDEPENDENT_AMBULATORY_CARE_PROVIDER_SITE_OTHER): Payer: Medicaid Other | Admitting: Pediatrics

## 2016-12-18 ENCOUNTER — Encounter: Payer: Self-pay | Admitting: Pediatrics

## 2016-12-18 VITALS — BP 131/89 | HR 97 | Ht 65.35 in | Wt 107.6 lb

## 2016-12-18 DIAGNOSIS — N898 Other specified noninflammatory disorders of vagina: Secondary | ICD-10-CM

## 2016-12-18 DIAGNOSIS — Z113 Encounter for screening for infections with a predominantly sexual mode of transmission: Secondary | ICD-10-CM

## 2016-12-18 DIAGNOSIS — R358 Other polyuria: Secondary | ICD-10-CM | POA: Diagnosis not present

## 2016-12-18 DIAGNOSIS — R3589 Other polyuria: Secondary | ICD-10-CM

## 2016-12-18 DIAGNOSIS — R634 Abnormal weight loss: Secondary | ICD-10-CM

## 2016-12-18 LAB — POCT URINALYSIS DIPSTICK
BILIRUBIN UA: NEGATIVE
Glucose, UA: NEGATIVE
KETONES UA: NEGATIVE
LEUKOCYTES UA: NEGATIVE
Nitrite, UA: NEGATIVE
PH UA: 7 (ref 5.0–8.0)
Protein, UA: NEGATIVE
RBC UA: NEGATIVE
SPEC GRAV UA: 1.01 (ref 1.010–1.025)
Urobilinogen, UA: NEGATIVE E.U./dL — AB

## 2016-12-18 NOTE — Patient Instructions (Addendum)
It was great seeing you today! We have addressed the following issues today  1. Vaginal discharge: we have done some tests today. It may take a couple of days to get the result back. If the results are abnormal, someone will contact you to discuss the plan.   Sign up for My Chart to have easy access to your labs results, and communication with your Primary care physician.    Please check-out at the front desk before leaving the clinic.    Take Care,   [phone] [facility]

## 2016-12-18 NOTE — Progress Notes (Signed)
  Subjective:    Judith Harvey is a 21 y.o. old female here for vaginal discharge.   HPI Vaginal discharge: for two months. Discharge is whitish and thick. Some odor to it. Not able to describe the odor. Denies itching, skin lesion, dysuria, hematuria, abdominal pain, pain with intercourse or fever. Admits increased frequency of urination. She was treated for BV about 9 months ago. Was treated for CT about two years ago. She is sexually active with female partner. One female partner in the last 12 months. Doesn't use condom. [facility]. LMP 12/02/2016.  Smokes one black and mild once a week. Denies EtOH or recreational drug use.    PMH/Problem List: has Surveillance of previously prescribed implantable subdermal contraceptive and Bacterial vaginosis on her problem list.   has a past medical history of Allergy.  FH:  No family history on file.  SH Social History  Substance Use Topics  . Smoking status: Current Some Day Smoker  . Smokeless tobacco: Never Used  . Alcohol use No    Review of Systems Review of systems negative except for pertinent positives and negatives in history of present illness above.     Objective:     Vitals:   12/18/16 1425  BP: 131/89  Pulse: 97  Weight: 107 lb 9.6 oz (48.8 kg)  Height: 5' 5.35" (1.66 m)    Physical Exam GEN: appears well, no apparent distress. Head: normocephalic and atraumatic  Eyes: conjunctiva without injection, sclera anicteric Oropharynx: mmm without erythema or exudation HEM: negative for cervical or periauricular lymphadenopathies Endo: no thyromegally CVS: RRR, nl S1&S2, no murmurs, no edema RESP: good air movement bilaterally, CTAB GI: BS present & normal, soft, NTND GU: no suprapubic or CVA tenderness MSK: no focal tenderness or notable swelling SKIN: no apparent skin lesion NEURO: alert and oiented appropriately, no gross defecits  PSYCH: euthymic mood with congruent affect    Assessment and Plan:  1. Routine  screening for STI (sexually transmitted infection) - GC/Chlamydia Probe Amp  2. Vaginal discharge - Wet Prep  3. Increased frequency of urination: UA negative today.   4. Cervical cancer screening:  -need PAP smear.   5. Weight loss: lost 12 lbs in the last 9 months. Likely due to low calorie intake with busy work schedule. She had promotion and works about 12-14 hours a day.  She says she likes to gain weight. No suspicion for eating disorder. No constitutional symptoms. Discussed about the importance regular meals. Will continue to monitor.  Almon Herculesaye T Yassen Kinnett, MD 12/18/16 Pager: [phone]

## 2016-12-19 LAB — GC/CHLAMYDIA PROBE AMP
CT Probe RNA: NOT DETECTED
GC Probe RNA: NOT DETECTED

## 2016-12-24 ENCOUNTER — Other Ambulatory Visit: Payer: Self-pay | Admitting: Pediatrics

## 2016-12-24 LAB — WET PREP BY MOLECULAR PROBE
Candida species: NOT DETECTED
Gardnerella vaginalis: DETECTED — AB
Trichomonas vaginosis: NOT DETECTED

## 2016-12-24 MED ORDER — METRONIDAZOLE 500 MG PO TABS: 500.0000 mg | ORAL_TABLET | Freq: Two times a day (BID) | ORAL | 0 refills | Status: DC

## 2017-02-02 ENCOUNTER — Ambulatory Visit (INDEPENDENT_AMBULATORY_CARE_PROVIDER_SITE_OTHER): Payer: Medicaid Other | Admitting: Pediatrics

## 2017-02-02 ENCOUNTER — Encounter: Payer: Self-pay | Admitting: Pediatrics

## 2017-02-02 ENCOUNTER — Other Ambulatory Visit (HOSPITAL_COMMUNITY)
Admission: RE | Admit: 2017-02-02 | Discharge: 2017-02-02 | Disposition: A | Discharge status: Home / Self Care | Payer: Medicaid Other | Source: Ambulatory Visit | Attending: Pediatrics | Admitting: Pediatrics

## 2017-02-02 VITALS — BP 133/87 | HR 95 | Ht 64.57 in | Wt 110.2 lb

## 2017-02-02 DIAGNOSIS — Z113 Encounter for screening for infections with a predominantly sexual mode of transmission: Secondary | ICD-10-CM | POA: Diagnosis not present

## 2017-02-02 DIAGNOSIS — Z3046 Encounter for surveillance of implantable subdermal contraceptive: Secondary | ICD-10-CM | POA: Diagnosis not present

## 2017-02-02 DIAGNOSIS — N898 Other specified noninflammatory disorders of vagina: Secondary | ICD-10-CM | POA: Diagnosis not present

## 2017-02-02 DIAGNOSIS — Z01419 Encounter for gynecological examination (general) (routine) without abnormal findings: Secondary | ICD-10-CM | POA: Insufficient documentation

## 2017-02-02 DIAGNOSIS — Z0001 Encounter for general adult medical examination with abnormal findings: Secondary | ICD-10-CM

## 2017-02-02 DIAGNOSIS — R87610 Atypical squamous cells of undetermined significance on cytologic smear of cervix (ASC-US): Secondary | ICD-10-CM | POA: Insufficient documentation

## 2017-02-02 LAB — WET PREP BY MOLECULAR PROBE
Candida species: NOT DETECTED
GARDNERELLA VAGINALIS: DETECTED — AB
Trichomonas vaginosis: NOT DETECTED

## 2017-02-02 NOTE — Addendum Note (Signed)
Addended by: Debroah Loop on: 02/02/2017 05:44 PM   Modules accepted: Orders

## 2017-02-02 NOTE — Patient Instructions (Addendum)
It was nice meeting you today Schwanna!  Since you are over 21 years old now, we will not longer be able to continue seeing you at our practice. We would recommend that you transition your care to [facility]. They accept your insurance and are accepting new patients. Their phone number is [phone]. Please call them at your earliest convenience to schedule an appointment to establish care there.   If you have any questions or concerns, please feel free to call the clinic.   Be well,  Dr. Natale Milch

## 2017-02-02 NOTE — Progress Notes (Signed)
Routine Well-Adolescent Visit   History was provided by the patient.  Judith Harvey is a 21 y.o. female who is here for annual [facility]. PCP Confirmed?  yes  No primary care provider on file.   Growth Chart Viewed? yes  HPI:   Patient diagnosed with BV at last visit in July. Said she did not complete course of Flagyl because it made her nauseated. She has not had any vaginal discharge since, but is concerned since she did not finish treatment.   Dental Care: Not medical home currently  Patient's last menstrual period was 01/17/2017.  Menstrual History: Irregular periods, sometimes two in one month, sometimes none at all. Says became irregular after Nexplanon was placed.    No Known Allergies  Past Medical History:  No significant PMH.  Past Medical History:  Diagnosis Date  . Allergy     Family History: No significant family medical history.  No family history on file.  Social History: Lives with: herself Friends/Peers: has friends, including long-term boyfriend School: has completed school, is now working full-time Nutrition/Eating Behaviors: Says she is not eating a very well-balanced diet. Primarily works the night shift, so often has to grab something quickly on her way to work. Typically eats fast food. She has been trying to improve her diet, and as such has started eating yogurt or fruit in place of french fries when she eats out. Is drinking a lot of soda, mainly Mt Dew or Dr. Reino Kent, but has started trying to drink Gatorade and water in place of sodas. She is working on [employer] out altogether.  Sports/Exercise:  None Screen time: >2hrs per day Sleep: about 4-6 hours per sleep, not too tired   Confidentiality was discussed with the patient and if applicable, with caregiver as well.  Tobacco? yes, Black and Milds (one a week) Secondhand smoke exposure?no Drugs/EtOH?no Sexually active?yes  Pregnancy Prevention: Nexplanon, reviewed condoms & plan B Safe at  home, in school & in relationships? Yes Guns in the home? no Safe to self? Yes     Physical Exam:  Vitals:   02/02/17 1428  BP: 133/87  Pulse: 95  Weight: 110 lb 3.2 oz (50 kg)  Height: 5' 4.57" (1.64 m)   BP 133/87 (BP Location: Right Arm, Patient Position: Sitting, Cuff Size: Normal)   Pulse 95   Ht 5' 4.57" (1.64 m)   Wt 110 lb 3.2 oz (50 kg)   LMP 01/17/2017   BMI 18.59 kg/m  Body mass index: body mass index is 18.59 kg/m.  Growth percentile SmartLinks can only be used for patients less than 66 years old.  Physical Exam  Constitutional: She is oriented to person, place, and time. She appears well-developed and well-nourished. No distress.  HENT:  Head: Normocephalic and atraumatic.  Nose: Nose normal.  Mouth/Throat: Oropharynx is clear and moist. No oropharyngeal exudate.  Eyes: Pupils are equal, round, and reactive to light. Conjunctivae and EOM are normal. Right eye exhibits no discharge. Left eye exhibits no discharge.  Neck: Normal range of motion. Neck supple. No thyromegaly present.  Cardiovascular: Normal rate, regular rhythm and normal heart sounds.   No murmur heard. Pulmonary/Chest: Effort normal and breath sounds normal. No respiratory distress. She has no wheezes.  Abdominal: Soft. Bowel sounds are normal. She exhibits no distension. There is no tenderness.  Genitourinary: Vagina normal and uterus normal. No vaginal discharge found.  Musculoskeletal: Normal range of motion.  Lymphadenopathy:    She has no cervical adenopathy.  Neurological:  She is alert and oriented to person, place, and time.  Skin: Skin is warm and dry.  Psychiatric: She has a normal mood and affect. Her behavior is normal.  Vitals reviewed.   Assessment/Plan: 1. Encounter for cervical Pap smear with pelvic exam Pap smear, wet prep, and GC/chlamydia collected today. Will call patient with results.  - Cytology - PAP  2. Surveillance of previously prescribed implantable subdermal  contraceptive Nexplanon placed last year. No complaints today. Will remove in two years when indicatedd.    Follow-up:  Will need to transition care to another office given age. Recommended transitioning to [facility]. Provided patient with contact information.    Tarri Abernethy, MD, MPH PGY-3

## 2017-02-03 LAB — GC/CHLAMYDIA PROBE AMP
CT Probe RNA: NOT DETECTED
GC Probe RNA: NOT DETECTED

## 2017-02-06 ENCOUNTER — Other Ambulatory Visit: Payer: Self-pay | Admitting: Pediatrics

## 2017-02-06 LAB — CYTOLOGY - PAP
Bacterial vaginitis: POSITIVE — AB
CANDIDA VAGINITIS: NEGATIVE
Chlamydia: NEGATIVE
Diagnosis: UNDETERMINED — AB
HPV (WINDOPATH): NOT DETECTED
NEISSERIA GONORRHEA: NEGATIVE
TRICH (WINDOWPATH): NEGATIVE

## 2017-02-06 MED ORDER — METRONIDAZOLE 0.75 % VA GEL: 1.0000 | Freq: Every day | VAGINAL | 1 refills | Status: AC

## 2017-02-17 ENCOUNTER — Telehealth: Payer: Self-pay | Admitting: Pediatrics

## 2017-02-17 NOTE — Telephone Encounter (Signed)
Patient stated the gel [facility] [address] [facility] [address] is [address].  She would like a different RX sent to the pharmacy that is not so expensive since she only has [employer].

## 2017-02-18 ENCOUNTER — Other Ambulatory Visit: Payer: Self-pay | Admitting: Family

## 2017-02-18 MED ORDER — METRONIDAZOLE 500 MG PO TABS: 500.0000 mg | ORAL_TABLET | Freq: Two times a day (BID) | ORAL | 0 refills | Status: DC

## 2017-02-18 NOTE — Telephone Encounter (Signed)
Called and left generic VM stating medication was sent to pharmacy and to call office for more details regarding medication.

## 2018-02-12 ENCOUNTER — Encounter

## 2018-09-09 DIAGNOSIS — R509 Fever, unspecified: Secondary | ICD-10-CM | POA: Diagnosis not present

## 2018-12-21 ENCOUNTER — Telehealth: Payer: Self-pay | Admitting: Family

## 2018-12-21 NOTE — Telephone Encounter (Signed)

## 2018-12-22 ENCOUNTER — Ambulatory Visit (INDEPENDENT_AMBULATORY_CARE_PROVIDER_SITE_OTHER): Payer: BC Managed Care – PPO | Admitting: Family

## 2018-12-22 ENCOUNTER — Encounter: Payer: Self-pay | Admitting: Family

## 2018-12-22 ENCOUNTER — Other Ambulatory Visit: Payer: Self-pay

## 2018-12-22 VITALS — BP 124/82 | HR 80 | Ht 64.0 in | Wt 135.2 lb

## 2018-12-22 DIAGNOSIS — Z3046 Encounter for surveillance of implantable subdermal contraceptive: Secondary | ICD-10-CM

## 2018-12-22 DIAGNOSIS — Z30017 Encounter for initial prescription of implantable subdermal contraceptive: Secondary | ICD-10-CM | POA: Diagnosis not present

## 2018-12-25 ENCOUNTER — Encounter: Payer: Self-pay | Admitting: Family

## 2018-12-25 MED ORDER — ETONOGESTREL 68 MG ~~LOC~~ IMPL
68.0000 mg | DRUG_IMPLANT | Freq: Once | SUBCUTANEOUS | Status: AC
  Administered 2018-12-22: 68 mg via SUBCUTANEOUS

## 2018-12-25 NOTE — Progress Notes (Signed)
History was provided by the patient.  Judith Harvey is a 23 y.o. female who is here for nexplanon removal and reinsertion.   PCP confirmed? Yes.      HPI:   Nexplanon was placed in 2017 She is happy with this method and was like replacement Is aware she is only seen through 23 yo so wants to get another nexplanon today No concerns for vaginal discharge changes, lesions; no breakthrough bleeding, no pelvic or abdominal pain.   Review of Systems  Constitutional: Negative for chills, fever and malaise/fatigue.  HENT: Negative for sore throat.   Eyes: Negative for blurred vision and double vision.  Respiratory: Negative for cough and shortness of breath.   Cardiovascular: Negative for chest pain and palpitations.  Gastrointestinal: Negative for abdominal pain.  Genitourinary: Negative for dysuria and urgency.  Musculoskeletal: Negative for joint pain and myalgias.  Skin: Negative for rash.  Neurological: Negative for dizziness and headaches.  Psychiatric/Behavioral: Negative for depression. The patient is not nervous/anxious.       Patient Active Problem List   Diagnosis Date Noted  . Bacterial vaginosis 02/22/2014  . Surveillance of previously prescribed implantable subdermal contraceptive 01/04/2013    Current Outpatient Medications on File Prior to Visit  Medication Sig Dispense Refill  . metroNIDAZOLE (FLAGYL) 500 MG tablet Take 1 tablet (500 mg total) by mouth 2 (two) times daily. (Patient not taking: Reported on 12/22/2018) 14 tablet 0   No current facility-administered medications on file prior to visit.     No Known Allergies  Physical Exam:    Vitals:   12/22/18 1147 12/22/18 1152  BP: (!) 134/92 124/82  Pulse: 92 80  Weight: 135 lb 3.2 oz (61.3 kg)   Height: 5\' 4"  (1.626 m)     Growth percentile SmartLinks can only be used for patients less than 25 years old. No LMP recorded.  Physical Exam Constitutional:      Appearance: Normal appearance. She is  not ill-appearing.  Eyes:     Extraocular Movements: Extraocular movements intact.     Pupils: Pupils are equal, round, and reactive to light.  Cardiovascular:     Rate and Rhythm: Normal rate.  Pulmonary:     Effort: Pulmonary effort is normal.  Musculoskeletal: Normal range of motion.        General: No swelling.  Skin:    General: Skin is warm and dry.  Neurological:     General: No focal deficit present.     Mental Status: She is alert and oriented to person, place, and time.  Psychiatric:        Mood and Affect: Mood normal.      Assessment/Plan: 1. Encounter for Nexplanon removal Risks & benefits of Nexplanon removal discussed. Consent form signed.  The patient denies any allergies to anesthetics or antiseptics.  Procedure: Pt was placed in supine position. left arm was flexed at the elbow and externally rotated so that her wrist was parallel to her ear, The device was palpated and marked. The site was cleaned with Betadine. The area surrounding the device was covered with a sterile drape. 1% lidocaine was injected just under the device. A scalpel was used to create a small incision. The device was pushed towards the incision. Fibrous tissue surrounding the device was gradually removed from the device. The device was removed and measured to ensure all 4 cm of device was removed. Steri-strips were used to close the incision. Pressure dressing was applied to the patient.  The  patient was instructed to removed the pressure dressing in 24 hrs.  The patient was advised to move slowly from a supine to an upright position  The patient denied any concerns or complaints  The patient was instructed to schedule a follow-up appt in 1 month. The patient will be called in 1 week to address any concerns.  2. Nexplanon insertion See procedure note.

## 2018-12-25 NOTE — Procedures (Signed)
Nexplanon Insertion  No contraindications for placement.  No liver disease, no unexplained vaginal bleeding, no h/o breast cancer, no h/o blood clots.  No LMP recorded.  UHCG: na - remove and replace  Last Unprotected sex:  na  Risks & benefits of Nexplanon discussed The nexplanon device was purchased and supplied by [facility]. Packaging instructions supplied to patient Consent form signed  The patient denies any allergies to anesthetics or antiseptics.  Procedure: Pt was placed in supine position. The left arm was flexed at the elbow and externally rotated so that her wrist was parallel to her ear The medial epicondyle of the left arm was identified The insertions site was marked 8 cm proximal to the medial epicondyle The insertion site was cleaned with Betadine The area surrounding the insertion site was covered with a sterile drape 1% lidocaine was injected just under the skin at the insertion site extending 4 cm proximally. The sterile preloaded disposable Nexaplanon applicator was removed from the sterile packaging The applicator needle was inserted at a 30 degree angle at 8 cm proximal to the medial epicondyle as marked The applicator was lowered to a horizontal position and advanced just under the skin for the full length of the needle The slider on the applicator was retracted fully while the applicator remained in the same position, then the applicator was removed. The implant was confirmed via palpation as being in position The implant position was demonstrated to the patient Pressure dressing was applied to the patient.  The patient was instructed to removed the pressure dressing in 24 hrs.  The patient was advised to move slowly from a supine to an upright position  The patient denied any concerns or complaints  The patient was instructed to schedule a follow-up appt in 1 month and to call sooner if any concerns.  The patient acknowledged agreement and understanding  of the plan.

## 2019-02-02 ENCOUNTER — Emergency Department (HOSPITAL_COMMUNITY)
Admission: EM | Admit: 2019-02-02 | Discharge: 2019-02-02 | Disposition: A | Discharge status: Home / Self Care | Payer: BC Managed Care – PPO | Attending: Emergency Medicine | Admitting: Emergency Medicine

## 2019-02-02 ENCOUNTER — Emergency Department (HOSPITAL_COMMUNITY): Payer: BC Managed Care – PPO

## 2019-02-02 ENCOUNTER — Encounter (HOSPITAL_COMMUNITY): Payer: Self-pay

## 2019-02-02 ENCOUNTER — Other Ambulatory Visit: Payer: Self-pay

## 2019-02-02 DIAGNOSIS — N200 Calculus of kidney: Secondary | ICD-10-CM

## 2019-02-02 DIAGNOSIS — R1011 Right upper quadrant pain: Secondary | ICD-10-CM | POA: Diagnosis not present

## 2019-02-02 DIAGNOSIS — N210 Calculus in bladder: Secondary | ICD-10-CM | POA: Insufficient documentation

## 2019-02-02 DIAGNOSIS — K8020 Calculus of gallbladder without cholecystitis without obstruction: Secondary | ICD-10-CM | POA: Diagnosis not present

## 2019-02-02 DIAGNOSIS — F17200 Nicotine dependence, unspecified, uncomplicated: Secondary | ICD-10-CM | POA: Diagnosis not present

## 2019-02-02 LAB — COMPREHENSIVE METABOLIC PANEL
ALT: 10 U/L (ref 0–44)
AST: 19 U/L (ref 15–41)
Albumin: 4.2 g/dL (ref 3.5–5.0)
Alkaline Phosphatase: 47 U/L (ref 38–126)
Anion gap: 10 (ref 5–15)
BUN: 14 mg/dL (ref 6–20)
CO2: 22 mmol/L (ref 22–32)
Calcium: 8.9 mg/dL (ref 8.9–10.3)
Chloride: 109 mmol/L (ref 98–111)
Creatinine, Ser: 0.94 mg/dL (ref 0.44–1.00)
GFR calc Af Amer: >60 mL/min (ref >60)
GFR calc non Af Amer: >60 mL/min (ref >60)
Glucose, Bld: 152 mg/dL — ABNORMAL HIGH (ref 70–99)
Potassium: 3.5 mmol/L (ref 3.5–5.1)
Sodium: 141 mmol/L (ref 135–145)
Total Bilirubin: 0.6 mg/dL (ref 0.3–1.2)
Total Protein: 8.3 g/dL — ABNORMAL HIGH (ref 6.5–8.1)

## 2019-02-02 LAB — URINALYSIS, ROUTINE W REFLEX MICROSCOPIC
Bilirubin Urine: NEGATIVE
Glucose, UA: NEGATIVE mg/dL
Ketones, ur: 5 mg/dL — AB
Nitrite: NEGATIVE
Protein, ur: 30 mg/dL — AB
RBC / HPF: >50 RBC/hpf — ABNORMAL HIGH (ref 0–5)
Specific Gravity, Urine: 1.024 (ref 1.005–1.030)
pH: 8 (ref 5.0–8.0)

## 2019-02-02 LAB — CBC WITH DIFFERENTIAL/PLATELET
Abs Immature Granulocytes: 0.08 10*3/uL — ABNORMAL HIGH (ref 0.00–0.07)
Basophils Absolute: 0 10*3/uL (ref 0.0–0.1)
Basophils Relative: 1 %
Eosinophils Absolute: 0 10*3/uL (ref 0.0–0.5)
Eosinophils Relative: 0 %
HCT: 36.1 % (ref 36.0–46.0)
Hemoglobin: 12.3 g/dL (ref 12.0–15.0)
Immature Granulocytes: 1 %
Lymphocytes Relative: 17 %
Lymphs Abs: 1.3 10*3/uL (ref 0.7–4.0)
MCH: 32.8 pg (ref 26.0–34.0)
MCHC: 34.1 g/dL (ref 30.0–36.0)
MCV: 96.3 fL (ref 80.0–100.0)
Monocytes Absolute: 0.4 10*3/uL (ref 0.1–1.0)
Monocytes Relative: 5 %
Neutro Abs: 6 10*3/uL (ref 1.7–7.7)
Neutrophils Relative %: 76 %
Platelets: 277 10*3/uL (ref 150–400)
RBC: 3.75 MIL/uL — ABNORMAL LOW (ref 3.87–5.11)
RDW: 11.6 % (ref 11.5–15.5)
WBC: 7.7 10*3/uL (ref 4.0–10.5)
nRBC: 0 % (ref 0.0–0.2)

## 2019-02-02 LAB — I-STAT BETA HCG BLOOD, ED (MC, WL, AP ONLY): I-stat hCG, quantitative: <5 m[IU]/mL (ref <5)

## 2019-02-02 LAB — LIPASE, BLOOD: Lipase: 24 U/L (ref 11–51)

## 2019-02-02 MED ORDER — SODIUM CHLORIDE 0.9 % IV BOLUS
1000.0000 mL | Freq: Once | INTRAVENOUS | Status: AC
  Administered 2019-02-02: 08:00:00 1000 mL via INTRAVENOUS

## 2019-02-02 MED ORDER — NAPROXEN 500 MG PO TABS: 500.0000 mg | ORAL_TABLET | Freq: Two times a day (BID) | ORAL | 0 refills | Status: AC | PRN

## 2019-02-02 MED ORDER — ONDANSETRON HCL 4 MG/2ML IJ SOLN
4.0000 mg | Freq: Once | INTRAMUSCULAR | Status: AC
  Administered 2019-02-02: 08:00:00 4 mg via INTRAVENOUS
  Filled 2019-02-02: qty 2

## 2019-02-02 MED ORDER — ONDANSETRON 4 MG PO TBDP: 4.0000 mg | ORAL_TABLET | Freq: Three times a day (TID) | ORAL | 0 refills | Status: AC | PRN

## 2019-02-02 MED ORDER — MORPHINE SULFATE (PF) 4 MG/ML IV SOLN
4.0000 mg | Freq: Once | INTRAVENOUS | Status: AC
  Administered 2019-02-02: 4 mg via INTRAVENOUS
  Filled 2019-02-02: qty 1

## 2019-02-02 NOTE — ED Provider Notes (Signed)
[facility] DEPT Provider Note   CSN: [id number] Arrival date & time: 02/02/19  0736     History   Chief Complaint Chief Complaint  Patient presents with  . Abdominal Pain    HPI Judith Harvey is a 23 y.o. female.     The history is provided by the patient and medical records. No language interpreter was used.  Abdominal Pain Associated symptoms: nausea and vomiting   Associated symptoms: no constipation and no diarrhea   Judith Harvey is an otherwise healthy 23 y.o. female who presents to the Emergency Department complaining of acute onset of right upper quadrant abdominal pain around 5 AM this morning.  Patient states that she did have [employer] last night around midnight then went to sleep.  She reports waking up with sharp epigastric and right upper quadrant pain without radiation.  Associated with nausea and 4 episodes of emesis.  No diarrhea.  Has had no fevers.  No history of similar symptoms.  No sick contacts.  Tried some ibuprofen at 7 AM this morning without relief.  No abdominal surgeries.  Denies urinary symptoms, chest pain, shortness of breath or back pain.   Past Medical History:  Diagnosis Date  . Allergy     Patient Active Problem List   Diagnosis Date Noted  . Surveillance of previously prescribed implantable subdermal contraceptive 01/04/2013    History reviewed. No pertinent surgical history.   OB History   No obstetric history on file.      Home Medications    Prior to Admission medications   Medication Sig Start Date End Date Taking? Authorizing Provider  ibuprofen (ADVIL) 200 MG tablet Take 200 mg by mouth every 6 (six) hours as needed for moderate pain.   Yes [provider]  naproxen (NAPROSYN) 500 MG tablet Take 1 tablet (500 mg total) by mouth 2 (two) times daily as needed. 02/02/19   Morganne Haile, Ozella Almond, PA-C  ondansetron (ZOFRAN ODT) 4 MG disintegrating tablet Take 1 tablet (4 mg total) by  mouth every 8 (eight) hours as needed for nausea or vomiting. 02/02/19   Jupiter Kabir, Ozella Almond, PA-C    Family History No family history on file.  Social History Social History   Tobacco Use  . Smoking status: Current Some Day Smoker  . Smokeless tobacco: Never Used  Substance Use Topics  . Alcohol use: Yes    Comment: socially  . Drug use: No     Allergies   Patient has no known allergies.   Review of Systems Review of Systems  Gastrointestinal: Positive for abdominal pain, nausea and vomiting. Negative for blood in stool, constipation and diarrhea.  All other systems reviewed and are negative.    Physical Exam Updated Vital Signs BP 120/71 (BP Location: Left Arm)   Pulse 90   Temp 97.9 F (36.6 C) (Oral)   Resp 16   Ht 5\' 4"  (1.626 m)   Wt 59.9 kg   LMP 01/12/2019 Comment: neg preg test 02/02/2019  SpO2 100%   BMI 22.66 kg/m   Physical Exam Vitals signs and nursing note reviewed.  Constitutional:      General: She is not in acute distress.    Appearance: She is well-developed.  HENT:     Head: Normocephalic and atraumatic.  Neck:     Musculoskeletal: Neck supple.  Cardiovascular:     Rate and Rhythm: Normal rate and regular rhythm.     Heart sounds: Normal heart sounds. No murmur.  Pulmonary:     Effort: Pulmonary effort is normal. No respiratory distress.     Breath sounds: Normal breath sounds.  Abdominal:     General: There is no distension.     Palpations: Abdomen is soft.     Comments: Right upper quadrant tenderness.  No flank or CVA tenderness.  Skin:    General: Skin is warm and dry.  Neurological:     Mental Status: She is alert and oriented to person, place, and time.      ED Treatments / Results  Labs (all labs ordered are listed, but only abnormal results are displayed) Labs Reviewed  CBC WITH DIFFERENTIAL/PLATELET - Abnormal; Notable for the following components:      Result Value   RBC 3.75 (*)    Abs Immature Granulocytes 0.08  (*)    All other components within normal limits  COMPREHENSIVE METABOLIC PANEL - Abnormal; Notable for the following components:   Glucose, Bld 152 (*)    Total Protein 8.3 (*)    All other components within normal limits  URINALYSIS, ROUTINE W REFLEX MICROSCOPIC - Abnormal; Notable for the following components:   APPearance TURBID (*)    Hgb urine dipstick MODERATE (*)    Ketones, ur 5 (*)    Protein, ur 30 (*)    Leukocytes,Ua TRACE (*)    RBC / HPF >50 (*)    Bacteria, UA FEW (*)    All other components within normal limits  LIPASE, BLOOD  I-STAT BETA HCG BLOOD, ED ([facility], [facility], [facility] ONLY)    EKG None  Radiology Ct Renal Stone Study  Result Date: 02/02/2019 CLINICAL DATA:  "Pt states RUQ since 0500 today. Pt states 3 episodes of emesis since then. Pt states she took ibuprofen at 0700 without relief. No hx of abd surgeries." EXAM: CT ABDOMEN AND PELVIS WITHOUT CONTRAST TECHNIQUE: Multidetector CT imaging of the abdomen and pelvis was performed following the standard protocol without IV contrast. COMPARISON:  None. FINDINGS: Lower chest: Motion limited. Minimal atelectasis in the lung bases. Limited evaluation of the abdominal viscera in the absence of IV contrast. Hepatobiliary: No focal liver abnormality is seen. No gallstones are appreciated, gallbladder wall thickening, or biliary dilatation. Pancreas: Unremarkable. No pancreatic ductal dilatation or surrounding inflammatory changes. Spleen: Normal in size without focal abnormality. Adrenals/Urinary Tract: Adrenal glands are unremarkable. There are bilateral tiny renal calculi, 2 in the right and 1 in the left kidney measuring 1-2 mm. There is slight asymmetric fullness of the right renal pelvis compared to the left. There is a 3 mm calculus in the dependent left bladder. Stomach/Bowel: Stomach is within normal limits. Appendix appears normal. No evidence of bowel wall thickening, distention, or inflammatory changes. Vascular/Lymphatic: No  significant vascular findings are present. No enlarged abdominal or pelvic lymph nodes. Reproductive: Uterus and bilateral adnexa are unremarkable. Other: No abdominal wall hernia or abnormality. No abdominopelvic ascites. Musculoskeletal: No acute or significant osseous findings. IMPRESSION: 1. Few bilateral renal calculi measuring 1-2 mm. There is a 3 mm calculus in the bladder. There is very slight asymmetric fullness of the right renal collecting system without an obstructing stone visualized. Perhaps this represents recent prior passage of a stone. 2. The gallstones appreciated on same day ultrasound are not seen on the CT. However there is no evidence of acute gallbladder inflammation. Electronically Signed   By: Emmaline Kluver M.D.   On: 02/02/2019 12:05   US Abdomen Limited Ruq  Result Date: 02/02/2019 CLINICAL DATA:  Right  upper quadrant pain with nausea and vomiting EXAM: ULTRASOUND ABDOMEN LIMITED RIGHT UPPER QUADRANT COMPARISON:  None. FINDINGS: Gallbladder: Within the gallbladder, there are echogenic foci which move but do not shadow appreciably. The largest of these foci measures 7 mm. There is no gallbladder wall thickening or pericholecystic fluid. No sonographic Murphy sign noted by sonographer. Common bile duct: Diameter: 4 mm. No intrahepatic or extrahepatic biliary duct dilatation. Liver: No focal lesion identified. Within normal limits in parenchymal echogenicity. Portal vein is patent on color Doppler imaging with normal direction of blood flow towards the liver. Other: There is mild fullness of the right renal collecting system. IMPRESSION: 1. Cholelithiasis. No shadowing from the apparent gallstones suggesting that these gallstones may have more pigmented character than calcification. No gallbladder wall thickening or pericholecystic fluid. 2. Mild fullness of the right renal collecting system. Obstructing focus not seen. The possibility of a calculus more distally in the right ureter  causing fullness of the right renal collecting system must be a consideration. 3.  Study otherwise unremarkable. Electronically Signed   By: Bretta BangWilliam  Woodruff III M.D.   On: 02/02/2019 09:16    Procedures Procedures (including critical care time)  Medications Ordered in ED Medications  sodium chloride 0.9 % bolus 1,000 mL (0 mLs Intravenous Stopped 02/02/19 0923)  ondansetron (ZOFRAN) injection 4 mg (4 mg Intravenous Given 02/02/19 0829)  morphine 4 MG/ML injection 4 mg (4 mg Intravenous Given 02/02/19 0829)     Initial Impression / Assessment and Plan / ED Course  I have reviewed the triage vital signs and the nursing notes.  Pertinent labs & imaging results that were available during my care of the patient were reviewed by me and considered in my medical decision making (see chart for details).       Judith Harvey is a 23 y.o. female who presents to ED for acute onset of right upper quadrant abdominal pain at 5 AM this morning associated with nausea and vomiting.  On exam, patient is afebrile, hemodynamically stable with tenderness to the right upper quadrant.  Will obtain labs, urine and ultrasound to further evaluate.  RUQ ultrasound shows gallstones, but no evidence of acute cholecystitis.  Does show mild fullness of the right renal collecting system making kidney stone possible.  CT renal study confirms 3mm kidney stone which is now in the bladder.  Feels as if her sxs today were 2/2 recently passed kidney stone.  UA with blood but no signs of infection also consistent with stone.  Labs reassuring including normal kidney function.  Patient feels improved on re-eval.  Discussed symptomatic home care measures, follow-up plan and return precautions.  All questions were answered.  Final Clinical Impressions(s) / ED Diagnoses   Final diagnoses:  Kidney stone    ED Discharge Orders         Ordered    naproxen (NAPROSYN) 500 MG tablet  2 times daily PRN     02/02/19 1230     ondansetron (ZOFRAN ODT) 4 MG disintegrating tablet  Every 8 hours PRN     02/02/19 1230           Morty Ortwein, Chase PicketJaime Pilcher, PA-C 02/02/19 1236    Tilden Fossaees, Elizabeth, MD 02/02/19 [phone]

## 2019-02-02 NOTE — ED Notes (Signed)
Ultrasound at bedside

## 2019-02-02 NOTE — ED Triage Notes (Signed)
Pt states RUQ since 0500 today. Pt states 3 episodes of emesis since then.   Pt states she took ibuprofen at 0700 without relief.  No hx of abd surgeries.

## 2019-02-02 NOTE — Discharge Instructions (Signed)
It was my pleasure taking care of you today!   You have been diagnosed with kidney stones. Drink plenty of fluids to help you pass the stone.  Take naproxen as directed with food for mild to moderate pain. Use Zofran for nausea as directed.  Follow up with your primary care doctor or the urology clinic listed in regards to your hospital visit.   Return to the ED immediately if you develop fever that persists > 101, uncontrolled pain or vomiting, or other concerns.    To find a primary care or specialty doctor please call [phone] or [phone] to access "[facility] Doctor Service."  You may also go on the [facility] website at [email or web address]  There are also multiple [facility] and [facility] practices throughout the [place] that are frequently accepting new patients. You may find a clinic that is close to your home and contact them.  [place] [address] [phone]  [facility] in [place] (also locations in [place] and [place]) - [place] [phone]  [place] [phone]

## 2020-01-12 ENCOUNTER — Emergency Department (HOSPITAL_COMMUNITY)
Admission: EM | Admit: 2020-01-12 | Discharge: 2020-01-12 | Disposition: A | Discharge status: Home / Self Care | Payer: No Typology Code available for payment source | Attending: Emergency Medicine | Admitting: Emergency Medicine

## 2020-01-12 ENCOUNTER — Encounter (HOSPITAL_COMMUNITY): Payer: Self-pay | Admitting: Emergency Medicine

## 2020-01-12 ENCOUNTER — Emergency Department (HOSPITAL_COMMUNITY): Payer: No Typology Code available for payment source

## 2020-01-12 DIAGNOSIS — S39012A Strain of muscle, fascia and tendon of lower back, initial encounter: Secondary | ICD-10-CM

## 2020-01-12 DIAGNOSIS — S00531A Contusion of lip, initial encounter: Secondary | ICD-10-CM | POA: Diagnosis not present

## 2020-01-12 DIAGNOSIS — Y999 Unspecified external cause status: Secondary | ICD-10-CM | POA: Insufficient documentation

## 2020-01-12 DIAGNOSIS — S8991XA Unspecified injury of right lower leg, initial encounter: Secondary | ICD-10-CM | POA: Diagnosis present

## 2020-01-12 DIAGNOSIS — Y92410 Unspecified street and highway as the place of occurrence of the external cause: Secondary | ICD-10-CM | POA: Diagnosis not present

## 2020-01-12 DIAGNOSIS — W2210XA Striking against or struck by unspecified automobile airbag, initial encounter: Secondary | ICD-10-CM | POA: Insufficient documentation

## 2020-01-12 DIAGNOSIS — Z7952 Long term (current) use of systemic steroids: Secondary | ICD-10-CM | POA: Diagnosis not present

## 2020-01-12 DIAGNOSIS — Y9389 Activity, other specified: Secondary | ICD-10-CM | POA: Diagnosis not present

## 2020-01-12 DIAGNOSIS — S8011XA Contusion of right lower leg, initial encounter: Secondary | ICD-10-CM | POA: Insufficient documentation

## 2020-01-12 DIAGNOSIS — F17200 Nicotine dependence, unspecified, uncomplicated: Secondary | ICD-10-CM | POA: Diagnosis not present

## 2020-01-12 DIAGNOSIS — S8012XA Contusion of left lower leg, initial encounter: Secondary | ICD-10-CM

## 2020-01-12 LAB — PREGNANCY, URINE: Preg Test, Ur: NEGATIVE

## 2020-01-12 MED ORDER — IBUPROFEN 200 MG PO TABS
400.0000 mg | ORAL_TABLET | Freq: Once | ORAL | Status: AC
  Administered 2020-01-12: 400 mg via ORAL
  Filled 2020-01-12: qty 2

## 2020-01-12 MED ORDER — METHOCARBAMOL 750 MG PO TABS: 750.0000 mg | ORAL_TABLET | Freq: Three times a day (TID) | ORAL | 0 refills | Status: AC | PRN
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
## 2020-01-12 NOTE — Discharge Instructions (Signed)
It was our pleasure to provide your ER care today - we hope that you feel better.  Take acetaminophen or ibuprofen as need for pain. You may also take robaxin as need for muscle pain/spasm - no driving when taking.   Follow up with primary care doctor in 1-2 weeks if symptoms fail to improve/resolve. Also follow up with primary care doctor for recheck of blood pressure, which is high today.  Return to ER if worse, new symptoms, new or severe pain, severe headache, numbness/weakness, or other concern.
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
